# Patient Record
Sex: Male | Born: 1955 | Race: White | Hispanic: No | Smoking: Never smoker
Health system: Southern US, Community
[De-identification: ages and names within clinical notes are randomized; demographics above are authoritative.]

## PROBLEM LIST (undated history)

## (undated) DIAGNOSIS — M1990 Unspecified osteoarthritis, unspecified site: Secondary | ICD-10-CM

## (undated) DIAGNOSIS — N200 Calculus of kidney: Secondary | ICD-10-CM

## (undated) DIAGNOSIS — Z87442 Personal history of urinary calculi: Secondary | ICD-10-CM

## (undated) HISTORY — PX: REPLACEMENT TOTAL KNEE: SUR1224

## (undated) HISTORY — PX: COLONOSCOPY: SHX174

## (undated) HISTORY — PX: ANKLE FRACTURE SURGERY: SHX122

## (undated) HISTORY — PX: CYSTOSCOPY: SUR368

---

## 2018-07-09 ENCOUNTER — Emergency Department: Payer: Non-veteran care

## 2018-07-09 ENCOUNTER — Encounter: Payer: Self-pay | Admitting: Emergency Medicine

## 2018-07-09 ENCOUNTER — Emergency Department
Admission: EM | Admit: 2018-07-09 | Discharge: 2018-07-09 | Disposition: A | Discharge status: Home / Self Care | Payer: Non-veteran care | Attending: Emergency Medicine | Admitting: Emergency Medicine

## 2018-07-09 ENCOUNTER — Other Ambulatory Visit: Payer: Self-pay

## 2018-07-09 DIAGNOSIS — N200 Calculus of kidney: Secondary | ICD-10-CM

## 2018-07-09 DIAGNOSIS — R1031 Right lower quadrant pain: Secondary | ICD-10-CM | POA: Diagnosis present

## 2018-07-09 HISTORY — DX: Calculus of kidney: N20.0

## 2018-07-09 MED ORDER — HYDROCODONE-ACETAMINOPHEN 5-325 MG PO TABS: 1.0000 | ORAL_TABLET | Freq: Four times a day (QID) | ORAL | 0 refills | Status: DC | PRN

## 2018-07-09 MED ORDER — FENTANYL CITRATE (PF) 100 MCG/2ML IJ SOLN
100.0000 ug | Freq: Once | INTRAMUSCULAR | Status: AC
  Administered 2018-07-09: 100 ug via INTRAVENOUS
  Filled 2018-07-09: qty 2

## 2018-07-09 MED ORDER — TAMSULOSIN HCL 0.4 MG PO CAPS: 0.4000 mg | ORAL_CAPSULE | Freq: Every day | ORAL | 0 refills | Status: AC

## 2018-07-09 MED ORDER — HYDROCODONE-ACETAMINOPHEN 5-325 MG PO TABS
1.0000 | ORAL_TABLET | Freq: Once | ORAL | Status: AC
  Administered 2018-07-09: 1 via ORAL
  Filled 2018-07-09: qty 1

## 2018-07-09 MED ORDER — SODIUM CHLORIDE 0.9 % IV BOLUS
1000.0000 mL | Freq: Once | INTRAVENOUS | Status: AC
  Administered 2018-07-09: 1000 mL via INTRAVENOUS

## 2018-07-09 MED ORDER — LIDOCAINE HCL (CARDIAC) PF 100 MG/5ML IV SOSY
1.5000 mg/kg | PREFILLED_SYRINGE | Freq: Once | INTRAVENOUS | Status: AC
  Administered 2018-07-09: 146.2 mg via INTRAVENOUS
  Filled 2018-07-09: qty 10

## 2018-07-09 MED ORDER — IBUPROFEN 600 MG PO TABS: 600.0000 mg | ORAL_TABLET | Freq: Three times a day (TID) | ORAL | 0 refills | Status: DC | PRN

## 2018-07-09 MED ORDER — TAMSULOSIN HCL 0.4 MG PO CAPS
0.4000 mg | ORAL_CAPSULE | Freq: Once | ORAL | Status: AC
  Administered 2018-07-09: 0.4 mg via ORAL
  Filled 2018-07-09: qty 1

## 2018-07-09 MED ORDER — KETOROLAC TROMETHAMINE 30 MG/ML IJ SOLN
30.0000 mg | Freq: Once | INTRAMUSCULAR | Status: AC
  Administered 2018-07-09: 30 mg via INTRAMUSCULAR
  Filled 2018-07-09: qty 1

## 2018-07-09 NOTE — ED Triage Notes (Signed)
.  Patient ambulatory to triage with steady gait, without difficulty or distress noted; pt reports right flank pain, nonradiating; onset hr PTA; st hx kidney stones

## 2018-07-09 NOTE — ED Provider Notes (Signed)
[facility] Emergency Department Provider Note  ____________________________________________   First MD Initiated Contact with Patient 07/09/18 0309     (approximate)  I have reviewed the triage vital signs and the nursing notes.   HISTORY  Chief Complaint Flank Pain  HPI Patrick Williamson is a 62 y.o. male who self presents to the emergency department with sudden onset severe right flank pain radiating towards his right groin that began about an hour ago.  The pain feels similar to previous kidney stone that he had "years ago".  He is never had to have surgery to remove kidney stone.  No fevers or chills.  No dysuria.  No chest pain or shortness of breath.  The pain came on suddenly with severe is now constant and nothing seems to make it better or worse.     Past Medical History:  Diagnosis Date  . Kidney stone     There are no active problems to display for this patient.   Past Surgical History:  Procedure Laterality Date  . REPLACEMENT TOTAL KNEE Bilateral     Prior to Admission medications   Medication Sig Start Date End Date Taking? Authorizing Provider  HYDROcodone-acetaminophen (NORCO) 5-325 MG tablet Take 1 tablet by mouth every 6 (six) hours as needed for up to 15 doses for severe pain. 07/09/18   Merrily Brittle, MD  ibuprofen (ADVIL,MOTRIN) 600 MG tablet Take 1 tablet (600 mg total) by mouth every 8 (eight) hours as needed. 07/09/18   Merrily Brittle, MD  tamsulosin (FLOMAX) 0.4 MG CAPS capsule Take 1 capsule (0.4 mg total) by mouth daily. 07/09/18   Merrily Brittle, MD    Allergies Penicillins  No family history on file.  Social History Social History   Tobacco Use  . Smoking status: Never Smoker  . Smokeless tobacco: Never Used  Substance Use Topics  . Alcohol use: Not on file  . Drug use: Not on file    Review of Systems Constitutional: No fever/chills Eyes: No visual changes. ENT: No sore throat. Cardiovascular: Denies chest  pain. Respiratory: Denies shortness of breath. Gastrointestinal: Positive for abdominal pain.  Positive for nausea, no vomiting.  No diarrhea.  No constipation. Genitourinary: Negative for dysuria. Musculoskeletal: Positive for back pain. Skin: Negative for rash. Neurological: Negative for headaches, focal weakness or numbness.   ____________________________________________   PHYSICAL EXAM:  VITAL SIGNS: ED Triage Vitals  Enc Vitals Group     BP 07/09/18 0307 (!) 183/110     Pulse Rate 07/09/18 0307 75     Resp 07/09/18 0307 20     Temp 07/09/18 0307 97.7 F (36.5 C)     Temp Source 07/09/18 0307 Oral     SpO2 07/09/18 0307 100 %     Weight 07/09/18 0306 215 lb (97.5 kg)     Height 07/09/18 0306 5\' 10"  (1.778 m)     Head Circumference --      Peak Flow --      Pain Score 07/09/18 0305 10     Pain Loc --      Pain Edu? --      Excl. in GC? --     Constitutional: Alert and oriented x4 appears miserable writhing in pain on the bed Eyes: PERRL EOMI. Head: Atraumatic. Nose: No congestion/rhinnorhea. Mouth/Throat: No trismus Neck: No stridor.   Cardiovascular: Normal rate, regular rhythm. Grossly normal heart sounds.  Good peripheral circulation. Respiratory: Normal respiratory effort.  No retractions. Lungs CTAB and moving good  air Gastrointestinal: Soft nontender no costovertebral tenderness no peritonitis Musculoskeletal: No lower extremity edema   Neurologic:  Normal speech and language. No gross focal neurologic deficits are appreciated. Skin:  Skin is warm, dry and intact. No rash noted. Psychiatric: Mood and affect are normal. Speech and behavior are normal.    ____________________________________________   DIFFERENTIAL includes but not limited to  Renal colic, pyelonephritis, AAA, infected stone ____________________________________________   LABS (all labs ordered are listed, but only abnormal results are displayed)  Labs Reviewed - No data to  display   __________________________________________  EKG   ____________________________________________  RADIOLOGY  CT stone reviewed by me consistent with 5 mm right sided kidney stone ____________________________________________   PROCEDURES  Procedure(s) performed: no  Procedures  Critical Care performed: no  ____________________________________________   INITIAL IMPRESSION / ASSESSMENT AND PLAN / ED COURSE  Pertinent labs & imaging results that were available during my care of the patient were reviewed by me and considered in my medical decision making (see chart for details).   As part of my medical decision making, I reviewed the following data within the electronic MEDICAL RECORD NUMBER History obtained from family if available, nursing notes, old chart and ekg, as well as notes from prior ED visits.  On arrival the patient was exquisitely uncomfortable appearing with an exam most consistent with renal colic.  Given 30 mg intramuscular ketorolac as well as a tablet of hydrocodone with minimal improvement in his pain.  CT scan does show a large stone but the patient's symptoms have not improved significantly enough so I started a line and then gave him 100 mcg of IV fentanyl and 150 mg of IV lidocaine over 20 minutes.  Following this his pain went down to is 0.  As this is a mid ureter stone that is 5 mm we will also start him on Flomax and refer him to urology.  Strict return precautions have been given.      ____________________________________________   FINAL CLINICAL IMPRESSION(S) / ED DIAGNOSES  Final diagnoses:  Kidney stone      NEW MEDICATIONS STARTED DURING THIS VISIT:  Discharge Medication List as of 07/09/2018  3:50 AM    START taking these medications   Details  HYDROcodone-acetaminophen (NORCO) 5-325 MG tablet Take 1 tablet by mouth every 6 (six) hours as needed for up to 15 doses for severe pain., Starting Thu 07/09/2018, Print    ibuprofen  (ADVIL,MOTRIN) 600 MG tablet Take 1 tablet (600 mg total) by mouth every 8 (eight) hours as needed., Starting Thu 07/09/2018, Print    tamsulosin (FLOMAX) 0.4 MG CAPS capsule Take 1 capsule (0.4 mg total) by mouth daily., Starting Thu 07/09/2018, Print         Note:  This document was prepared using Dragon voice recognition software and may include unintentional dictation errors.     Merrily Brittle, MD 07/11/18 [phone]

## 2018-07-09 NOTE — Discharge Instructions (Addendum)
Please take your pain medication as needed for severe symptoms and use Flomax every day to help your kidney stone pass.  Follow-up with the urologist this coming week for reevaluation.  Return to the emergency department sooner for any new or worsening symptoms particularly if you develop a fever or chills at all.  It was a pleasure to take care of you today, and thank you for coming to our emergency department.  If you have any questions or concerns before leaving please ask the nurse to grab me and I'm more than happy to go through your aftercare instructions again.  If you were prescribed any opioid pain medication today such as Norco, Vicodin, Percocet, morphine, hydrocodone, or oxycodone please make sure you do not drive when you are taking this medication as it can alter your ability to drive safely.  If you have any concerns once you are home that you are not improving or are in fact getting worse before you can make it to your follow-up appointment, please do not hesitate to call [phone] and come back for further evaluation.  Merrily Brittle, MD  No results found for this or any previous visit. Ct Renal Stone Study  Result Date: 07/09/2018 CLINICAL DATA:  Right flank pain. EXAM: CT ABDOMEN AND PELVIS WITHOUT CONTRAST TECHNIQUE: Multidetector CT imaging of the abdomen and pelvis was performed following the standard protocol without IV contrast. COMPARISON:  None. FINDINGS: Lower chest: Lung bases are clear. Hepatobiliary: No focal liver abnormality is seen. No gallstones, gallbladder wall thickening, or biliary dilatation. Pancreas: Unremarkable. No pancreatic ductal dilatation or surrounding inflammatory changes. Spleen: Normal in size without focal abnormality. Adrenals/Urinary Tract: No adrenal gland nodules. Stone in the mid right ureter at the level of L3. Stone measures 5 mm diameter. Proximal hydronephrosis and hydroureter. Left kidney and ureter and the bladder are unremarkable. Stomach/Bowel:  Stomach is within normal limits. Appendix is not identified. No evidence of bowel wall thickening, distention, or inflammatory changes. Vascular/Lymphatic: No significant vascular findings are present. No enlarged abdominal or pelvic lymph nodes. Reproductive: Prostate gland is enlarged, measuring 4.5 cm diameter. Other: No abdominal wall hernia or abnormality. No abdominopelvic ascites. Musculoskeletal: No acute or significant osseous findings. IMPRESSION: 1. 5 mm stone in the mid right ureter with moderate proximal obstruction. 2. Enlarged prostate gland. Electronically Signed   By: Burman Nieves M.D.   On: 07/09/2018 03:44

## 2018-09-01 NOTE — H&P (Signed)
  Patient's anticipated LOS is less than 2 midnights, meeting these requirements: - Younger than 44 - Lives within 1 hour of care - Has a competent adult at home to recover with post-op recover - NO history of  - Chronic pain requiring opiods  - Diabetes  - Coronary Artery Disease  - Heart failure  - Heart attack  - Stroke  - DVT/VTE  - Cardiac arrhythmia  - Respiratory Failure/COPD  - Renal failure  - Anemia  - Advanced Liver disease       Patrick Williamson is an 62 y.o. male.    Chief Complaint: left shoulder pain  HPI: Pt is a 62 y.o. male complaining of left shoulder pain for multiple years. Pain had continually increased since the beginning. X-rays in the clinic show end-stage arthritic changes of the left shoulder. Pt has tried various conservative treatments which have failed to alleviate their symptoms, including injections and therapy. Various options are discussed with the patient. Risks, benefits and expectations were discussed with the patient. Patient understand the risks, benefits and expectations and wishes to proceed with surgery.   [facility]  D/C Plans: Home  PMH: Past Medical History:  Diagnosis Date  . Kidney stone     PSH: Past Surgical History:  Procedure Laterality Date  . REPLACEMENT TOTAL KNEE Bilateral     Social History:  reports that he has never smoked. He has never used smokeless tobacco. His alcohol and drug histories are not on file.  Allergies:  Allergies  Allergen Reactions  . Penicillins     Medications: No current facility-administered medications for this encounter.    Current Outpatient Medications  Medication Sig Dispense Refill  . HYDROcodone-acetaminophen (NORCO) 5-325 MG tablet Take 1 tablet by mouth every 6 (six) hours as needed for up to 15 doses for severe pain. 15 tablet 0  . ibuprofen (ADVIL,MOTRIN) 600 MG tablet Take 1 tablet (600 mg total) by mouth every 8 (eight) hours as needed. 30 tablet 0  .  tamsulosin (FLOMAX) 0.4 MG CAPS capsule Take 1 capsule (0.4 mg total) by mouth daily. 30 capsule 0    No results found for this or any previous visit (from the past 48 hour(s)). No results found.  ROS: Pain with rom of the left upper extremity  Physical Exam: Alert and oriented 62 y.o. male in no acute distress Cranial nerves 2-12 intact Cervical spine: full rom with no tenderness, nv intact distally Chest: active breath sounds bilaterally, no wheeze rhonchi or rales Heart: regular rate and rhythm, no murmur Abd: non tender non distended with active bowel sounds Hip is stable with rom  Left shoulder with crepitus and pain with rom nv intact distally No rashes or edema distally  Assessment/Plan Assessment: left shoulder end stage osteoarthritis  Plan:  Patient will undergo a left total shoulder by Dr. Ranell Patrick at [facility]. Risks benefits and expectations were discussed with the patient. Patient understand risks, benefits and expectations and wishes to proceed. Preoperative templating of the joint replacement has been completed, documented, and submitted to the Operating Room personnel in order to optimize intra-operative equipment management.   Alphonsa Overall PA-C, MPAS [facility] is now [employer] [address], [address], [place], [place] [address] Phone: [phone] [email or web address] Facebook  [employer]

## 2018-09-09 NOTE — Pre-Procedure Instructions (Signed)
Patrick Williamson  09/09/2018      [id number] - [place], [place] - [address] [place] [address] Phone: [phone] Fax: [phone]    Your procedure is scheduled on Friday November 15th.  Report to [facility] Admitting at 0530 A.M.  Call this number if you have problems the morning of surgery:  [phone]   Remember:  Do not eat or drink after midnight.    Take these medicines the morning of surgery with A SIP OF WATER   Tetrahydrozoline HCl (VISINE OP) if needed  7 days prior to surgery STOP taking any Aspirin(unless otherwise instructed by your surgeon), Aleve, Naproxen, Ibuprofen, Motrin, Advil, Goody's, BC's, all herbal medications, fish oil, and all vitamins     Do not wear jewelry.  Do not wear lotions, powders, or colognes, or deodorant.  Men may shave face and neck.  Do not bring valuables to the hospital.  [facility] is not responsible for any belongings or valuables.  Contacts, dentures or bridgework may not be worn into surgery.  Leave your suitcase in the car.  After surgery it may be brought to your room.  For patients admitted to the hospital, discharge time will be determined by your treatment team.  Patients discharged the day of surgery will not be allowed to drive home.    [facility]- Preparing For Surgery  Before surgery, you can play an important role. Because skin is not sterile, your skin needs to be as free of germs as possible. You can reduce the number of germs on your skin by washing with CHG (chlorahexidine gluconate) Soap before surgery.  CHG is an antiseptic cleaner which kills germs and bonds with the skin to continue killing germs even after washing.    Oral Hygiene is also important to reduce your risk of infection.  Remember - BRUSH YOUR TEETH THE MORNING OF SURGERY WITH YOUR REGULAR TOOTHPASTE  Please do not use if you have an allergy to CHG or antibacterial soaps. If your skin becomes reddened/irritated  stop using the CHG.  Do not shave (including legs and underarms) for at least 48 hours prior to first CHG shower. It is OK to shave your face.  Please follow these instructions carefully.   1. Shower the NIGHT BEFORE SURGERY and the MORNING OF SURGERY with CHG.   2. If you chose to wash your hair, wash your hair first as usual with your normal shampoo.  3. After you shampoo, rinse your hair and body thoroughly to remove the shampoo.  4. Use CHG as you would any other liquid soap. You can apply CHG directly to the skin and wash gently with a scrungie or a clean washcloth.   5. Apply the CHG Soap to your body ONLY FROM THE NECK DOWN.  Do not use on open wounds or open sores. Avoid contact with your eyes, ears, mouth and genitals (private parts). Wash Face and genitals (private parts)  with your normal soap.  6. Wash thoroughly, paying special attention to the area where your surgery will be performed.  7. Thoroughly rinse your body with warm water from the neck down.  8. DO NOT shower/wash with your normal soap after using and rinsing off the CHG Soap.  9. Pat yourself dry with a CLEAN TOWEL.  10. Wear CLEAN PAJAMAS to bed the night before surgery, wear comfortable clothes the morning of surgery  11. Place CLEAN SHEETS on your bed the  night of your first shower and DO NOT SLEEP WITH PETS.    Day of Surgery:  Do not apply any deodorants/lotions.  Please wear clean clothes to the hospital/surgery center.   Remember to brush your teeth WITH YOUR REGULAR TOOTHPASTE.    Please read over the following fact sheets that you were given.

## 2018-09-10 ENCOUNTER — Other Ambulatory Visit: Payer: Self-pay

## 2018-09-10 ENCOUNTER — Encounter (HOSPITAL_COMMUNITY)
Admission: RE | Admit: 2018-09-10 | Discharge: 2018-09-10 | Disposition: A | Discharge status: Home / Self Care | Payer: No Typology Code available for payment source | Source: Ambulatory Visit | Attending: Orthopedic Surgery | Admitting: Orthopedic Surgery

## 2018-09-10 ENCOUNTER — Encounter (HOSPITAL_COMMUNITY): Payer: Self-pay

## 2018-09-10 DIAGNOSIS — M19012 Primary osteoarthritis, left shoulder: Secondary | ICD-10-CM | POA: Insufficient documentation

## 2018-09-10 DIAGNOSIS — Z79899 Other long term (current) drug therapy: Secondary | ICD-10-CM | POA: Insufficient documentation

## 2018-09-10 DIAGNOSIS — Z7982 Long term (current) use of aspirin: Secondary | ICD-10-CM | POA: Diagnosis not present

## 2018-09-10 DIAGNOSIS — Z01812 Encounter for preprocedural laboratory examination: Secondary | ICD-10-CM | POA: Insufficient documentation

## 2018-09-10 HISTORY — DX: Personal history of urinary calculi: Z87.442

## 2018-09-10 HISTORY — DX: Unspecified osteoarthritis, unspecified site: M19.90

## 2018-09-10 LAB — CBC
HEMATOCRIT: 48.4 % (ref 39.0–52.0)
Hemoglobin: 15.8 g/dL (ref 13.0–17.0)
MCH: 30.6 pg (ref 26.0–34.0)
MCHC: 32.6 g/dL (ref 30.0–36.0)
MCV: 93.8 fL (ref 80.0–100.0)
NRBC: 0 % (ref 0.0–0.2)
PLATELETS: 246 10*3/uL (ref 150–400)
RBC: 5.16 MIL/uL (ref 4.22–5.81)
RDW: 12.3 % (ref 11.5–15.5)
WBC: 6.2 10*3/uL (ref 4.0–10.5)

## 2018-09-10 LAB — SURGICAL PCR SCREEN
MRSA, PCR: NEGATIVE
STAPHYLOCOCCUS AUREUS: NEGATIVE

## 2018-09-10 NOTE — Progress Notes (Signed)
[facility]   Blood Thinner Instructions: N/A  Aspirin Instructions: will stop 7 days prior to surgery  Anesthesia review: yes. Requested EKG  Patient denies shortness of breath, fever, cough and chest pain at PAT appointment   Patient verbalized understanding of instructions that were given to them at the PAT appointment. Patient was also instructed that they will need to review over the PAT instructions again at home before surgery.

## 2018-09-11 NOTE — Progress Notes (Signed)
Anesthesia Chart Review:   Case:  [id number] Date/Time:  09/18/18 0715   Procedure:  LEFT ANATOMIC TOTAL SHOULDER ARTHROPLASTY (Left Shoulder)   Anesthesia type:  General   Pre-op diagnosis:  left shoulder end stage osteoarthritis   Location:  [facility] ROOM 05 / [facility] OR   Surgeon:  Beverely Low, MD      DISCUSSION: Patient is a 62 year old male scheduled for the above procedure.   History includes never smoker, arthritis, nephrolithiasis (obstructing stone in the mid right ureter with worsening right sided hydronephrosis and Creatinine 1.4 07/10/18, s/p right ureteroscopy with lithotripsy and stent 07/29/18, [facility]).  He has medical clearance at "low risk" (note appears to be signed by Jana Hakim, MD with [facility]).   Patient thought he may have had an EKG done at the [facility], but records requested and received without EKG. Surgeon deferred EKG "per anesthesia." Based on known history, I do not think he meets anesthesia guidelines for required preoperative EKG. In addition, labs ordered "per anesthesia" and a BMET was not done at his PAT visit based on history given; however, labs later received from the [facility] showed at least mild renal insufficiency--but in the case of 07/10/18 Cr of 1.4, the obstructive right renal stone was likely contributing although his BUN and Cr were also slightly elevated with 12/03/17 labs showing BUN 26, Cr 1.29. I will order an ISTAT8 for the morning of surgery just to get get a more recent baseline.  If ISTAT8 results stable and otherwise no acute changes then I would anticipate that he can proceed as planned.     VS: BP 138/84   Pulse 63   Temp 36.6 C   Resp 20   Ht 5\' 10"  (1.778 m)   Wt 103.6 kg   SpO2 100%   BMI 32.76 kg/m    PROVIDERS: [facility]   LABS: Labs reviewed: Acceptable for surgery. CBC done with PAT labs based on anesthesia guidelines. Previous labs from PCP at [facility] on 12/03/17 showed Cr 1.29, BUN 26, NA 140, K 4.6, glucose 118 and Cr  1.4 07/10/18 in the setting of renal stone. ISTAT8 on the day of surgery for more recent baseline of his renal function.  (all labs ordered are listed, but only abnormal results are displayed)  Labs Reviewed  SURGICAL PCR SCREEN  CBC    EKG: No EKG received from the [facility]. No EKG in [facility]. EKG on 11/245/14 showed SB at 46 bpm per Result Narrative in [facility].    CV: N/A   Past Medical History:  Diagnosis Date  . Arthritis    everywhere  . History of kidney stones   . Kidney stone     Past Surgical History:  Procedure Laterality Date  . ANKLE FRACTURE SURGERY    . COLONOSCOPY    . CYSTOSCOPY    . REPLACEMENT TOTAL KNEE Bilateral     MEDICATIONS: . aspirin 325 MG EC tablet  . aspirin EC 81 MG tablet  . diphenhydrAMINE-APAP, sleep, (EXCEDRIN PM) 38-500 MG TABS  . HYDROcodone-acetaminophen (NORCO) 5-325 MG tablet  . ibuprofen (ADVIL,MOTRIN) 600 MG tablet  . sildenafil (REVATIO) 20 MG tablet  . tamsulosin (FLOMAX) 0.4 MG CAPS capsule  . Tetrahydrozoline HCl (VISINE OP)   No current facility-administered medications for this encounter.   He is on sildenafil PRN for ED. He will hold ASA 7 days prior to surgery.    Shonna Chock, PA-C [facility]esthesiology Phone [phone])  [phone] 09/11/2018 5:49 PM

## 2018-09-17 NOTE — Anesthesia Preprocedure Evaluation (Addendum)
Anesthesia Evaluation  Patient identified by MRN, date of birth, ID band Patient awake    Reviewed: Allergy & Precautions, NPO status , Patient's Chart, lab work & pertinent test results  History of Anesthesia Complications Negative for: history of anesthetic complications  Airway Mallampati: II  TM Distance: >3 FB Neck ROM: Full    Dental no notable dental hx. (+) Teeth Intact, Chipped,    Pulmonary neg pulmonary ROS,    Pulmonary exam normal        Cardiovascular negative cardio ROS Normal cardiovascular exam     Neuro/Psych negative neurological ROS  negative psych ROS   GI/Hepatic negative GI ROS, Neg liver ROS,   Endo/Other  negative endocrine ROS  Renal/GU Renal InsufficiencyRenal disease (renal insufficiency 2/2 obstruction from stones, s/p recent lithotripsy)  negative genitourinary   Musculoskeletal  (+) Arthritis , Osteoarthritis,    Abdominal   Peds  Hematology negative hematology ROS (+)   Anesthesia Other Findings   Reproductive/Obstetrics                           Anesthesia Physical Anesthesia Plan  ASA: II  Anesthesia Plan: General   Post-op Pain Management: GA combined w/ Regional for post-op pain   Induction: Intravenous  PONV Risk Score and Plan: 2 and Ondansetron, Dexamethasone, Midazolam and Treatment may vary due to age or medical condition  Airway Management Planned: Oral ETT  Additional Equipment: None  Intra-op Plan:   Post-operative Plan: Extubation in OR  Informed Consent: I have reviewed the patients History and Physical, chart, labs and discussed the procedure including the risks, benefits and alternatives for the proposed anesthesia with the patient or authorized representative who has indicated his/her understanding and acceptance.     Plan Discussed with:   Anesthesia Plan Comments:        Anesthesia Quick Evaluation

## 2018-09-18 ENCOUNTER — Inpatient Hospital Stay (HOSPITAL_COMMUNITY): Payer: No Typology Code available for payment source | Admitting: Vascular Surgery

## 2018-09-18 ENCOUNTER — Encounter (HOSPITAL_COMMUNITY)
Admission: RE | Disposition: A | Discharge status: Home / Self Care | Payer: Self-pay | Source: Home / Self Care | Attending: Orthopedic Surgery

## 2018-09-18 ENCOUNTER — Inpatient Hospital Stay (HOSPITAL_COMMUNITY)
Admission: RE | Admit: 2018-09-18 | Discharge: 2018-09-19 | DRG: 483 | Disposition: A | Discharge status: Home / Self Care | Payer: No Typology Code available for payment source | Attending: Orthopedic Surgery | Admitting: Orthopedic Surgery

## 2018-09-18 ENCOUNTER — Inpatient Hospital Stay (HOSPITAL_COMMUNITY): Payer: No Typology Code available for payment source

## 2018-09-18 ENCOUNTER — Encounter (HOSPITAL_COMMUNITY): Payer: Self-pay | Admitting: Anesthesiology

## 2018-09-18 ENCOUNTER — Other Ambulatory Visit: Payer: Self-pay

## 2018-09-18 DIAGNOSIS — Z880 Allergy status to penicillin: Secondary | ICD-10-CM | POA: Diagnosis not present

## 2018-09-18 DIAGNOSIS — Z87442 Personal history of urinary calculi: Secondary | ICD-10-CM | POA: Diagnosis not present

## 2018-09-18 DIAGNOSIS — Z79899 Other long term (current) drug therapy: Secondary | ICD-10-CM

## 2018-09-18 DIAGNOSIS — M25712 Osteophyte, left shoulder: Secondary | ICD-10-CM | POA: Diagnosis present

## 2018-09-18 DIAGNOSIS — Z96653 Presence of artificial knee joint, bilateral: Secondary | ICD-10-CM | POA: Diagnosis present

## 2018-09-18 DIAGNOSIS — Z96612 Presence of left artificial shoulder joint: Secondary | ICD-10-CM

## 2018-09-18 DIAGNOSIS — M19012 Primary osteoarthritis, left shoulder: Principal | ICD-10-CM | POA: Diagnosis present

## 2018-09-18 HISTORY — PX: TOTAL SHOULDER ARTHROPLASTY: SHX126

## 2018-09-18 LAB — POCT I-STAT, CHEM 8
BUN: 15 mg/dL (ref 6–20)
CALCIUM ION: 1.15 mmol/L (ref 1.15–1.40)
Chloride: 107 mmol/L (ref 98–111)
Creatinine, Ser: 1 mg/dL (ref 0.61–1.24)
Glucose, Bld: 110 mg/dL — ABNORMAL HIGH (ref 70–99)
HCT: 42 % (ref 39.0–52.0)
HEMOGLOBIN: 14.3 g/dL (ref 13.0–17.0)
Potassium: 3.9 mmol/L (ref 3.5–5.1)
SODIUM: 141 mmol/L (ref 135–145)
TCO2: 24 mmol/L (ref 22–32)

## 2018-09-18 SURGERY — ARTHROPLASTY, SHOULDER, TOTAL
Anesthesia: General | Site: Shoulder | Laterality: Left

## 2018-09-18 MED ORDER — OXYCODONE-ACETAMINOPHEN 5-325 MG PO TABS: 1.0000 | ORAL_TABLET | ORAL | 0 refills | Status: AC | PRN

## 2018-09-18 MED ORDER — PHENOL 1.4 % MT LIQD: 1.0000 | OROMUCOSAL | Status: DC | PRN

## 2018-09-18 MED ORDER — SUGAMMADEX SODIUM 200 MG/2ML IV SOLN
INTRAVENOUS | Status: AC
  Filled 2018-09-18: qty 2

## 2018-09-18 MED ORDER — BUPIVACAINE-EPINEPHRINE 0.25% -1:200000 IJ SOLN
INTRAMUSCULAR | Status: DC | PRN
  Administered 2018-09-18: 10 mL

## 2018-09-18 MED ORDER — THROMBIN 5000 UNITS EX SOLR
CUTANEOUS | Status: DC | PRN
  Administered 2018-09-18: 5000 [IU] via TOPICAL

## 2018-09-18 MED ORDER — CLINDAMYCIN PHOSPHATE 900 MG/50ML IV SOLN
900.0000 mg | INTRAVENOUS | Status: AC
  Administered 2018-09-18: 900 mg via INTRAVENOUS
  Filled 2018-09-18: qty 50

## 2018-09-18 MED ORDER — DIPHENHYDRAMINE HCL 25 MG PO CAPS
50.0000 mg | ORAL_CAPSULE | Freq: Every day | ORAL | Status: DC
  Administered 2018-09-18: 50 mg via ORAL
  Filled 2018-09-18: qty 2

## 2018-09-18 MED ORDER — HYDROMORPHONE HCL 1 MG/ML IJ SOLN: 0.5000 mg | INTRAMUSCULAR | Status: DC | PRN

## 2018-09-18 MED ORDER — ONDANSETRON HCL 4 MG/2ML IJ SOLN
INTRAMUSCULAR | Status: AC
  Filled 2018-09-18: qty 2

## 2018-09-18 MED ORDER — SILDENAFIL CITRATE 20 MG PO TABS: 60.0000 mg | ORAL_TABLET | Freq: Every day | ORAL | Status: DC | PRN

## 2018-09-18 MED ORDER — FENTANYL CITRATE (PF) 100 MCG/2ML IJ SOLN
INTRAMUSCULAR | Status: DC | PRN
  Administered 2018-09-18: 50 ug via INTRAVENOUS

## 2018-09-18 MED ORDER — METHOCARBAMOL 500 MG PO TABS
500.0000 mg | ORAL_TABLET | Freq: Four times a day (QID) | ORAL | Status: DC | PRN
  Administered 2018-09-18 – 2018-09-19 (×2): 500 mg via ORAL
  Filled 2018-09-18: qty 1

## 2018-09-18 MED ORDER — BISACODYL 10 MG RE SUPP: 10.0000 mg | Freq: Every day | RECTAL | Status: DC | PRN

## 2018-09-18 MED ORDER — METHOCARBAMOL 500 MG PO TABS
ORAL_TABLET | ORAL | Status: AC
  Filled 2018-09-18: qty 1

## 2018-09-18 MED ORDER — OXYCODONE HCL 5 MG PO TABS
5.0000 mg | ORAL_TABLET | ORAL | Status: DC | PRN
  Administered 2018-09-18 (×2): 10 mg via ORAL
  Administered 2018-09-19: 5 mg via ORAL
  Filled 2018-09-18 (×2): qty 2

## 2018-09-18 MED ORDER — ROCURONIUM BROMIDE 50 MG/5ML IV SOSY
PREFILLED_SYRINGE | INTRAVENOUS | Status: AC
  Filled 2018-09-18: qty 5

## 2018-09-18 MED ORDER — BUPIVACAINE-EPINEPHRINE (PF) 0.25% -1:200000 IJ SOLN
INTRAMUSCULAR | Status: AC
  Filled 2018-09-18: qty 30

## 2018-09-18 MED ORDER — HEMOSTATIC AGENTS (NO CHARGE) OPTIME
TOPICAL | Status: DC | PRN
  Administered 2018-09-18: 1

## 2018-09-18 MED ORDER — BUPIVACAINE HCL (PF) 0.5 % IJ SOLN
INTRAMUSCULAR | Status: DC | PRN
  Administered 2018-09-18: 20 mL via PERINEURAL

## 2018-09-18 MED ORDER — METOCLOPRAMIDE HCL 5 MG PO TABS: 5.0000 mg | ORAL_TABLET | Freq: Three times a day (TID) | ORAL | Status: DC | PRN

## 2018-09-18 MED ORDER — TETRAHYDROZOLINE HCL 0.05 % OP SOLN: 1.0000 [drp] | Freq: Every day | OPHTHALMIC | Status: DC | PRN

## 2018-09-18 MED ORDER — FENTANYL CITRATE (PF) 100 MCG/2ML IJ SOLN: 25.0000 ug | INTRAMUSCULAR | Status: DC | PRN

## 2018-09-18 MED ORDER — FENTANYL CITRATE (PF) 250 MCG/5ML IJ SOLN
INTRAMUSCULAR | Status: AC
  Filled 2018-09-18: qty 5

## 2018-09-18 MED ORDER — BUPIVACAINE LIPOSOME 1.3 % IJ SUSP
INTRAMUSCULAR | Status: DC | PRN
  Administered 2018-09-18: 10 mL

## 2018-09-18 MED ORDER — POLYETHYLENE GLYCOL 3350 17 G PO PACK: 17.0000 g | PACK | Freq: Every day | ORAL | Status: DC | PRN

## 2018-09-18 MED ORDER — LIDOCAINE 2% (20 MG/ML) 5 ML SYRINGE
INTRAMUSCULAR | Status: DC | PRN
  Administered 2018-09-18: 100 mg via INTRAVENOUS

## 2018-09-18 MED ORDER — LACTATED RINGERS IV SOLN
INTRAVENOUS | Status: DC | PRN
  Administered 2018-09-18: 07:00:00 via INTRAVENOUS

## 2018-09-18 MED ORDER — ASPIRIN EC 81 MG PO TBEC
81.0000 mg | DELAYED_RELEASE_TABLET | Freq: Every day | ORAL | Status: DC
  Administered 2018-09-18 – 2018-09-19 (×2): 81 mg via ORAL
  Filled 2018-09-18 (×2): qty 1

## 2018-09-18 MED ORDER — METOCLOPRAMIDE HCL 5 MG/ML IJ SOLN: 5.0000 mg | Freq: Three times a day (TID) | INTRAMUSCULAR | Status: DC | PRN

## 2018-09-18 MED ORDER — METHOCARBAMOL 1000 MG/10ML IJ SOLN
500.0000 mg | Freq: Four times a day (QID) | INTRAVENOUS | Status: DC | PRN
  Filled 2018-09-18: qty 5

## 2018-09-18 MED ORDER — METHOCARBAMOL 500 MG PO TABS: 500.0000 mg | ORAL_TABLET | Freq: Three times a day (TID) | ORAL | 1 refills | Status: AC | PRN

## 2018-09-18 MED ORDER — 0.9 % SODIUM CHLORIDE (POUR BTL) OPTIME
TOPICAL | Status: DC | PRN
  Administered 2018-09-18: 1000 mL

## 2018-09-18 MED ORDER — CLINDAMYCIN PHOSPHATE 600 MG/50ML IV SOLN
600.0000 mg | Freq: Four times a day (QID) | INTRAVENOUS | Status: AC
  Administered 2018-09-18 – 2018-09-19 (×3): 600 mg via INTRAVENOUS
  Filled 2018-09-18 (×3): qty 50

## 2018-09-18 MED ORDER — DIPHENHYDRAMINE-APAP (SLEEP) 38-500 MG PO TABS: 2.0000 | ORAL_TABLET | Freq: Every day | ORAL | Status: DC

## 2018-09-18 MED ORDER — ONDANSETRON HCL 4 MG/2ML IJ SOLN: 4.0000 mg | Freq: Four times a day (QID) | INTRAMUSCULAR | Status: DC | PRN

## 2018-09-18 MED ORDER — ONDANSETRON HCL 4 MG PO TABS: 4.0000 mg | ORAL_TABLET | Freq: Four times a day (QID) | ORAL | Status: DC | PRN

## 2018-09-18 MED ORDER — OXYCODONE HCL 5 MG/5ML PO SOLN: 5.0000 mg | Freq: Once | ORAL | Status: DC | PRN

## 2018-09-18 MED ORDER — ACETAMINOPHEN 500 MG PO TABS
1000.0000 mg | ORAL_TABLET | Freq: Every day | ORAL | Status: DC
  Administered 2018-09-18: 1000 mg via ORAL
  Filled 2018-09-18: qty 2

## 2018-09-18 MED ORDER — ONDANSETRON HCL 4 MG/2ML IJ SOLN: 4.0000 mg | Freq: Once | INTRAMUSCULAR | Status: DC | PRN

## 2018-09-18 MED ORDER — CHLORHEXIDINE GLUCONATE 4 % EX LIQD: 60.0000 mL | Freq: Once | CUTANEOUS | Status: DC

## 2018-09-18 MED ORDER — DEXAMETHASONE SODIUM PHOSPHATE 10 MG/ML IJ SOLN
INTRAMUSCULAR | Status: DC | PRN
  Administered 2018-09-18: 10 mg via INTRAVENOUS

## 2018-09-18 MED ORDER — ACETAMINOPHEN 325 MG PO TABS: 325.0000 mg | ORAL_TABLET | Freq: Four times a day (QID) | ORAL | Status: DC | PRN

## 2018-09-18 MED ORDER — LIDOCAINE 2% (20 MG/ML) 5 ML SYRINGE
INTRAMUSCULAR | Status: AC
  Filled 2018-09-18: qty 5

## 2018-09-18 MED ORDER — PROPOFOL 10 MG/ML IV BOLUS
INTRAVENOUS | Status: AC
  Filled 2018-09-18: qty 20

## 2018-09-18 MED ORDER — DOCUSATE SODIUM 100 MG PO CAPS
100.0000 mg | ORAL_CAPSULE | Freq: Two times a day (BID) | ORAL | Status: DC
  Administered 2018-09-18 – 2018-09-19 (×2): 100 mg via ORAL
  Filled 2018-09-18 (×3): qty 1

## 2018-09-18 MED ORDER — THROMBIN (RECOMBINANT) 5000 UNITS EX SOLR
CUTANEOUS | Status: AC
  Filled 2018-09-18: qty 5000

## 2018-09-18 MED ORDER — ROCURONIUM BROMIDE 50 MG/5ML IV SOSY
PREFILLED_SYRINGE | INTRAVENOUS | Status: DC | PRN
  Administered 2018-09-18: 50 mg via INTRAVENOUS

## 2018-09-18 MED ORDER — PROPOFOL 10 MG/ML IV BOLUS
INTRAVENOUS | Status: DC | PRN
  Administered 2018-09-18: 200 mg via INTRAVENOUS

## 2018-09-18 MED ORDER — SUGAMMADEX SODIUM 200 MG/2ML IV SOLN
INTRAVENOUS | Status: DC | PRN
  Administered 2018-09-18: 200 mg via INTRAVENOUS

## 2018-09-18 MED ORDER — OXYCODONE HCL 5 MG PO TABS: 5.0000 mg | ORAL_TABLET | Freq: Once | ORAL | Status: DC | PRN

## 2018-09-18 MED ORDER — MIDAZOLAM HCL 2 MG/2ML IJ SOLN
INTRAMUSCULAR | Status: AC
  Filled 2018-09-18: qty 2

## 2018-09-18 MED ORDER — DEXAMETHASONE SODIUM PHOSPHATE 10 MG/ML IJ SOLN
INTRAMUSCULAR | Status: AC
  Filled 2018-09-18: qty 1

## 2018-09-18 MED ORDER — SODIUM CHLORIDE 0.9 % IV SOLN
INTRAVENOUS | Status: DC
  Administered 2018-09-18: 18:00:00 via INTRAVENOUS

## 2018-09-18 MED ORDER — OXYCODONE HCL 5 MG PO TABS
ORAL_TABLET | ORAL | Status: AC
  Filled 2018-09-18: qty 2

## 2018-09-18 MED ORDER — MIDAZOLAM HCL 5 MG/5ML IJ SOLN
INTRAMUSCULAR | Status: DC | PRN
  Administered 2018-09-18: 2 mg via INTRAVENOUS

## 2018-09-18 MED ORDER — ASPIRIN EC 325 MG PO TBEC: 650.0000 mg | DELAYED_RELEASE_TABLET | Freq: Every day | ORAL | Status: DC | PRN

## 2018-09-18 MED ORDER — MENTHOL 3 MG MT LOZG: 1.0000 | LOZENGE | OROMUCOSAL | Status: DC | PRN

## 2018-09-18 MED ORDER — SODIUM CHLORIDE 0.9 % IV SOLN
INTRAVENOUS | Status: DC | PRN
  Administered 2018-09-18: 25 ug/min via INTRAVENOUS

## 2018-09-18 MED ORDER — ONDANSETRON HCL 4 MG/2ML IJ SOLN
INTRAMUSCULAR | Status: DC | PRN
  Administered 2018-09-18: 4 mg via INTRAVENOUS

## 2018-09-18 SURGICAL SUPPLY — 69 items
BIT DRILL 5/64X5 DISP (BIT) ×3 IMPLANT
BLADE SAW SAG 73X25 THK (BLADE) ×2
BLADE SAW SGTL 73X25 THK (BLADE) ×1 IMPLANT
BODY UNITE ANATOMIC SZ12 (Miscellaneous) ×3 IMPLANT
CEMENT HV SMART SET (Cement) ×3 IMPLANT
CLOSURE STERI-STRIP 1/2X4 (GAUZE/BANDAGES/DRESSINGS) ×1
CLOSURE WOUND 1/2 X4 (GAUZE/BANDAGES/DRESSINGS) ×1
CLSR STERI-STRIP ANTIMIC 1/2X4 (GAUZE/BANDAGES/DRESSINGS) ×2 IMPLANT
CONT SPEC 4OZ CLIKSEAL STRL BL (MISCELLANEOUS) ×3 IMPLANT
COVER SURGICAL LIGHT HANDLE (MISCELLANEOUS) ×3 IMPLANT
COVER WAND RF STERILE (DRAPES) ×3 IMPLANT
DRAPE INCISE IOBAN 66X45 STRL (DRAPES) ×3 IMPLANT
DRAPE ORTHO SPLIT 77X108 STRL (DRAPES) ×4
DRAPE SURG ORHT 6 SPLT 77X108 (DRAPES) ×2 IMPLANT
DRAPE U-SHAPE 47X51 STRL (DRAPES) ×3 IMPLANT
DRSG ADAPTIC 3X8 NADH LF (GAUZE/BANDAGES/DRESSINGS) ×3 IMPLANT
DRSG PAD ABDOMINAL 8X10 ST (GAUZE/BANDAGES/DRESSINGS) ×3 IMPLANT
DURAPREP 26ML APPLICATOR (WOUND CARE) ×3 IMPLANT
ELECT BLADE 4.0 EZ CLEAN MEGAD (MISCELLANEOUS) ×3
ELECT NEEDLE TIP 2.8 STRL (NEEDLE) ×3 IMPLANT
ELECT REM PT RETURN 9FT ADLT (ELECTROSURGICAL) ×3
ELECTRODE BLDE 4.0 EZ CLN MEGD (MISCELLANEOUS) ×1 IMPLANT
ELECTRODE REM PT RTRN 9FT ADLT (ELECTROSURGICAL) ×1 IMPLANT
GAUZE SPONGE 4X4 12PLY STRL (GAUZE/BANDAGES/DRESSINGS) ×3 IMPLANT
GLENOID ANCHOR PEG CROSSLK 48 (Orthopedic Implant) ×3 IMPLANT
GLOVE BIOGEL PI ORTHO PRO 7.5 (GLOVE) ×2
GLOVE BIOGEL PI ORTHO PRO SZ8 (GLOVE) ×2
GLOVE ORTHO TXT STRL SZ7.5 (GLOVE) ×3 IMPLANT
GLOVE PI ORTHO PRO STRL 7.5 (GLOVE) ×1 IMPLANT
GLOVE PI ORTHO PRO STRL SZ8 (GLOVE) ×1 IMPLANT
GLOVE SURG ORTHO 8.5 STRL (GLOVE) ×3 IMPLANT
GOWN STRL REUS W/ TWL XL LVL3 (GOWN DISPOSABLE) ×2 IMPLANT
GOWN STRL REUS W/TWL XL LVL3 (GOWN DISPOSABLE) ×4
HEAD HUMERAL ECCEN 52MX21M (Head) ×3 IMPLANT
KIT BASIN OR (CUSTOM PROCEDURE TRAY) ×3 IMPLANT
KIT TURNOVER KIT B (KITS) ×3 IMPLANT
MANIFOLD NEPTUNE II (INSTRUMENTS) ×3 IMPLANT
NDL SUT 6 .5 CRC .975X.05 MAYO (NEEDLE) ×1 IMPLANT
NEEDLE 1/2 CIR MAYO (NEEDLE) ×3 IMPLANT
NEEDLE HYPO 25GX1X1/2 BEV (NEEDLE) ×3 IMPLANT
NEEDLE MAYO TAPER (NEEDLE) ×2
NS IRRIG 1000ML POUR BTL (IV SOLUTION) ×3 IMPLANT
PACK SHOULDER (CUSTOM PROCEDURE TRAY) ×3 IMPLANT
PAD ARMBOARD 7.5X6 YLW CONV (MISCELLANEOUS) ×6 IMPLANT
PIN METAGLENE 2.5 (PIN) ×3 IMPLANT
SLING ARM FOAM STRAP LRG (SOFTGOODS) ×3 IMPLANT
SLING ARM IMMOBILIZER LRG (SOFTGOODS) ×3 IMPLANT
SMARTMIX MINI TOWER (MISCELLANEOUS) ×3
SPONGE LAP 18X18 X RAY DECT (DISPOSABLE) ×3 IMPLANT
SPONGE LAP 4X18 RFD (DISPOSABLE) ×6 IMPLANT
SPONGE SURGIFOAM ABS GEL SZ50 (HEMOSTASIS) ×3 IMPLANT
STEM UNITE SZ12 (Stem) ×3 IMPLANT
STRIP CLOSURE SKIN 1/2X4 (GAUZE/BANDAGES/DRESSINGS) ×2 IMPLANT
SUCTION FRAZIER HANDLE 10FR (MISCELLANEOUS) ×2
SUCTION TUBE FRAZIER 10FR DISP (MISCELLANEOUS) ×1 IMPLANT
SUT FIBERWIRE #2 38 T-5 BLUE (SUTURE) ×12
SUT MNCRL AB 4-0 PS2 18 (SUTURE) ×3 IMPLANT
SUT VIC AB 0 CT1 27 (SUTURE) ×2
SUT VIC AB 0 CT1 27XBRD ANBCTR (SUTURE) ×1 IMPLANT
SUT VIC AB 0 CT2 27 (SUTURE) ×6 IMPLANT
SUT VIC AB 2-0 CT1 27 (SUTURE) ×2
SUT VIC AB 2-0 CT1 TAPERPNT 27 (SUTURE) ×1 IMPLANT
SUT VICRYL AB 2 0 TIES (SUTURE) ×3 IMPLANT
SUTURE FIBERWR #2 38 T-5 BLUE (SUTURE) ×4 IMPLANT
SYR CONTROL 10ML LL (SYRINGE) ×3 IMPLANT
TAPE CLOTH SURG 6X10 WHT LF (GAUZE/BANDAGES/DRESSINGS) ×3 IMPLANT
TOWEL OR 17X26 10 PK STRL BLUE (TOWEL DISPOSABLE) ×3 IMPLANT
TOWER SMARTMIX MINI (MISCELLANEOUS) ×1 IMPLANT
YANKAUER SUCT BULB TIP NO VENT (SUCTIONS) ×3 IMPLANT

## 2018-09-18 NOTE — Discharge Instructions (Signed)
Ice to the shoulder constantly.  Keep the incision covered and clean and dry for one week, then ok to get it wet in the shower. ° °Do exercise as instructed several times per day. ° °DO NOT reach behind your back or push up out of a chair with the operative arm. ° °Use a sling while you are up and around for comfort, may remove while seated.  Keep pillow propped behind the operative elbow. ° °Follow up with Dr Tailer Volkert in two weeks in the office, call [phone] for appt °

## 2018-09-18 NOTE — Anesthesia Procedure Notes (Signed)
Anesthesia Regional Block: Interscalene brachial plexus block   Pre-Anesthetic Checklist: ,, timeout performed, Correct Patient, Correct Site, Correct Laterality, Correct Procedure, Correct Position, site marked, Risks and benefits discussed,  Surgical consent,  Pre-op evaluation,  At surgeon's request and post-op pain management  Laterality: Left  Prep: chloraprep       Needles:  Injection technique: Single-shot  Needle Type: Echogenic Stimulator Needle     Needle Length: 9cm  Needle Gauge: 21     Additional Needles:   Procedures:,,,, ultrasound used (permanent image in chart),,,,  Narrative:  Start time: 09/18/2018 7:10 AM End time: 09/18/2018 7:15 AM Injection made incrementally with aspirations every 5 mL.  Performed by: Personally  Anesthesiologist: Lucretia KernWitman, Loralye Loberg E, MD  Additional Notes: Monitors applied. Injection made in 5cc increments. No resistance to injection. Good needle visualization. Patient tolerated procedure well.

## 2018-09-18 NOTE — Anesthesia Procedure Notes (Signed)
Procedure Name: Intubation Date/Time: 09/18/2018 7:50 AM Performed by: Kyung Rudd, CRNA Pre-anesthesia Checklist: Patient identified, Emergency Drugs available, Suction available and Patient being monitored Patient Re-evaluated:Patient Re-evaluated prior to induction Oxygen Delivery Method: Circle system utilized Preoxygenation: Pre-oxygenation with 100% oxygen Induction Type: IV induction Ventilation: Mask ventilation without difficulty and Oral airway inserted - appropriate to patient size Laryngoscope Size: Mac and 4 Grade View: Grade I Tube type: Oral Tube size: 7.5 mm Number of attempts: 1 Airway Equipment and Method: Stylet Placement Confirmation: ETT inserted through vocal cords under direct vision,  positive ETCO2 and breath sounds checked- equal and bilateral Secured at: 22 cm Tube secured with: Tape Dental Injury: Teeth and Oropharynx as per pre-operative assessment

## 2018-09-18 NOTE — Transfer of Care (Signed)
Immediate Anesthesia Transfer of Care Note  Patient: Patrick Williamson  Procedure(s) Performed: LEFT ANATOMIC TOTAL SHOULDER ARTHROPLASTY (Left Shoulder)  Patient Location: PACU  Anesthesia Type:GA combined with regional for post-op pain  Level of Consciousness: awake, alert  and oriented  Airway & Oxygen Therapy: Patient Spontanous Breathing and Patient connected to nasal cannula oxygen  Post-op Assessment: Report given to RN and Post -op Vital signs reviewed and stable  Post vital signs: Reviewed and stable  Last Vitals:  Vitals Value Taken Time  BP 135/80 09/18/2018 10:32 AM  Temp    Pulse 80 09/18/2018 10:35 AM  Resp 17 09/18/2018 10:35 AM  SpO2 98 % 09/18/2018 10:35 AM  Vitals shown include unvalidated device data.  Last Pain:  Vitals:   09/18/18 0624  PainSc: 2          Complications: No apparent anesthesia complications

## 2018-09-18 NOTE — Interval H&P Note (Signed)
History and Physical Interval Note:  09/18/2018 7:36 AM  Patrick Williamson  has presented today for surgery, with the diagnosis of left shoulder end stage osteoarthritis  The various methods of treatment have been discussed with the patient and family. After consideration of risks, benefits and other options for treatment, the patient has consented to  Procedure(s): LEFT ANATOMIC TOTAL SHOULDER ARTHROPLASTY (Left) as a surgical intervention .  The patient's history has been reviewed, patient examined, no change in status, stable for surgery.  I have reviewed the patient's chart and labs.  Questions were answered to the patient's satisfaction.     [name],STEVEN R

## 2018-09-18 NOTE — Plan of Care (Signed)

## 2018-09-18 NOTE — Brief Op Note (Signed)
09/18/2018  10:36 AM  PATIENT:  Patrick Williamson  62 y.o. male  PRE-OPERATIVE DIAGNOSIS:  left shoulder end stage osteoarthritis  POST-OPERATIVE DIAGNOSIS:  left shoulder end stage osteoarthritis  PROCEDURE:  Procedure(s): LEFT ANATOMIC TOTAL SHOULDER ARTHROPLASTY (Left) DePuy Global Unite  SURGEON:  Surgeon(s) and Role:    Beverely Low* Dianah Pruett, MD - Primary  PHYSICIAN ASSISTANT:   ASSISTANTS: Thea Gisthomas B Dixon, PA-C   ANESTHESIA:   regional and general  EBL:  150 mL   BLOOD ADMINISTERED:none  DRAINS: none   LOCAL MEDICATIONS USED:  MARCAINE     SPECIMEN:  No Specimen  DISPOSITION OF SPECIMEN:  N/A  COUNTS:  YES  TOURNIQUET:  * No tourniquets in log *  DICTATION: .Other Dictation: Dictation Number [id number]  PLAN OF CARE: Admit to inpatient   PATIENT DISPOSITION:  PACU - hemodynamically stable.   Delay start of Pharmacological VTE agent (>24hrs) due to surgical blood loss or risk of bleeding: not applicable

## 2018-09-18 NOTE — Op Note (Signed)
NAME: ROMMEL, HOGSTON MEDICAL RECORD ZO:[id number] ACCOUNT [id number] DATE OF BIRTH:1956-05-06 FACILITY: [facility] LOCATION: [facility] PHYSICIAN:STEVEN Russ Halo, MD  OPERATIVE REPORT  DATE OF PROCEDURE:  09/18/2018  PREOPERATIVE DIAGNOSIS:  Left shoulder end-stage arthritis.  POSTOPERATIVE DIAGNOSIS:  Left shoulder end-stage arthritis.  PROCEDURE PERFORMED:  Left anatomic shoulder replacement using DePuy Global Unite system.  ATTENDING SURGEON:  Malon Kindle, MD  ASSISTANT:  Modesto Charon, [place], who was scrubbed during the entire procedure and necessary for satisfactory completion of surgery.  ANESTHESIA:  General anesthesia was used plus interscalene block.  ESTIMATED BLOOD LOSS:  100 mL.  FLUID REPLACEMENT:  1500 mL crystalloid.  INSTRUMENT COUNTS:  Correct.  COMPLICATIONS:  No complications.  ANTIBIOTICS:  Perioperative antibiotics were given.  INDICATIONS:  The patient is a 62 year old male with a history of worsening left shoulder pain and dysfunction secondary to end-stage primary arthritis.  The patient has had a failure of conservative management over an extended period of time and  presents with disabling pain and poor function, desiring operative treatment to the shoulder.  Informed consent obtained.  DESCRIPTION OF PROCEDURE:  After an adequate level of anesthesia was achieved, the patient was positioned in the modified beach chair position.  Left shoulder correctly identified and sterilely prepped and draped in the usual manner.  Timeout called.  We  verified correct patient and correct site.  We then entered the shoulder using standard deltopectoral approach starting at the coracoid process extending down to the anterior humerus.  Dissection down through the subcutaneous tissues using Bovie  electrocautery.  Cephalic vein identified and taken laterally with the deltoid pectoralis taken medially.  Conjoined tendon identified and retracted medially.  Deep retractors  placed.  We tenodesed the biceps in situ with 0 Vicryl figure-of-eight suture.   We then released the subscapularis subperiosteally off the lesser tuberosity and tagged for repair at the end.  We released the inferior capsule, progressively externally rotating the humerus.  We then placed a T-handled elevator over the top of the  humeral head underneath the rotator cuff and a large Crego elevator on the medial side of the humeral neck.  We then placed the elbow at the patient's side and 20 degrees of external rotation and cut the humerus using a neck resection guide and the  oscillating saw.  We removed excess osteophytes with a rongeur.  We then subluxed the humerus posteriorly and did a 360-degree capsular labral excision.  We were able to visualize the glenoid face.  There was complete loss of cartilage both on the  humeral head and glenoid face.  We found our center point for our glenoid reaming.  We placed our guide pin and then reamed first with the low profile reamer and then the regular reamer for the 48 glenoid.  We were using the global APG glenoid or anchor  peg glenoid.  We then did our peripheral hand reaming.  We drilled our central peg hole and our three peripheral holes using the guide referencing off the inferior 6 o'clock position on the scapular neck.  At this point, we used Gelfoam soaked with  thrombin in the three peripheral holes.  We then vacuum mixed DePuy high viscosity cement, cemented the three peripheral holes and impacted the APG glenoid into position.  We held that until cement was hardened on the field.  We irrigated thoroughly.  We  then went ahead and extended the shoulder.  We completed our humeral preparation with a reaming up to a size 12  into the canal size and then using broaches for the 10 and the 12 placed in 20 degrees of retroversion.  Once we had that humeral preparation  done, we placed our trial implant and then used a 52 x 18 eccentric and then went with a 52 x  21 eccentric.  We gave us excellent coverage over the cut face of the bone.  We made sure again no additional osteophytes were noted.  We did retrieve about 4  or 5 large loose bodies from the shoulder.  We did a final inspection for loose bodies.  We removed the trial components from the humeral side.  We then selected the real press-fit Global Unite stem size 12 with the 52 x 21 eccentric.  We impacted the  stem into place using available bone graft and impacted grafting technique after we placed drill holes in the bone and placed sutures for the repair of the subscap.  We then took the 52 x 21 eccentric.  Dialed that eccentricity superiorly and  posteriorly.  We had excellent coverage and impacted that and reduced the shoulder.  The shoulder was stable and had good motion.  We then repaired anatomically the subscapularis back to bone utilizing transosseous sutures as well as rotator interval  repair.  The tenodesis was reinforced as well.  We had excellent motion with the shoulder and it was quite stable.  We irrigated again and then closed deltopectoral interval with 0 Vicryl suture followed by 2-0 Vicryl for subcutaneous closure and 4-0  Monocryl for skin.  Steri-Strips applied followed by sterile dressing.  The patient tolerated surgery well.  TN/NUANCE  D:09/18/2018 T:09/18/2018 JOB:[id number]

## 2018-09-19 ENCOUNTER — Encounter (HOSPITAL_COMMUNITY): Payer: Self-pay | Admitting: Orthopedic Surgery

## 2018-09-19 LAB — BASIC METABOLIC PANEL
Anion gap: 11 (ref 5–15)
BUN: 14 mg/dL (ref 6–20)
CALCIUM: 8.7 mg/dL — AB (ref 8.9–10.3)
CO2: 20 mmol/L — ABNORMAL LOW (ref 22–32)
Chloride: 103 mmol/L (ref 98–111)
Creatinine, Ser: 1.22 mg/dL (ref 0.61–1.24)
GFR calc Af Amer: >60 mL/min (ref >60)
GFR calc non Af Amer: >60 mL/min (ref >60)
Glucose, Bld: 254 mg/dL — ABNORMAL HIGH (ref 70–99)
POTASSIUM: 4 mmol/L (ref 3.5–5.1)
SODIUM: 134 mmol/L — AB (ref 135–145)

## 2018-09-19 LAB — HEMOGLOBIN AND HEMATOCRIT, BLOOD
HCT: 36.8 % — ABNORMAL LOW (ref 39.0–52.0)
HEMOGLOBIN: 12.5 g/dL — AB (ref 13.0–17.0)

## 2018-09-19 NOTE — Plan of Care (Signed)
  Problem: Nutrition: Goal: Adequate nutrition will be maintained Outcome: Progressing   Problem: Elimination: Goal: Will not experience complications related to bowel motility Outcome: Progressing   Problem: Pain Managment: Goal: General experience of comfort will improve Outcome: Progressing   Problem: Safety: Goal: Ability to remain free from injury will improve Outcome: Progressing   

## 2018-09-19 NOTE — Progress Notes (Signed)
Provided discharge education/instructions, all questions and concerns addressed, Pt not in distress, Pt to discharge home with belongings.

## 2018-09-19 NOTE — Progress Notes (Signed)
Subjective: 1 Day Post-Op Procedure(s) (LRB): LEFT ANATOMIC TOTAL SHOULDER ARTHROPLASTY (Left) Patient reports pain as mild.  Motor function has returned. Still good pain control at shoulder level  Objective: Vital signs in last 24 hours: Temp:  [97.9 F (36.6 C)-99 F (37.2 C)] 98.4 F (36.9 C) (11/16 0455) Pulse Rate:  [75-94] 75 (11/16 0455) Resp:  [15-27] 16 (11/15 1630) BP: (118-135)/(67-89) 119/68 (11/16 0455) SpO2:  [94 %-100 %] 96 % (11/16 0455)  Intake/Output from previous day: 11/15 0701 - 11/16 0700 In: 1536.8 [P.O.:220; I.V.:1195.9; IV Piggyback:120.9] Out: 150 [Blood:150] Intake/Output this shift: No intake/output data recorded.  Recent Labs    09/18/18 0623 09/19/18 0435  HGB 14.3 12.5*   Recent Labs    09/18/18 0623 09/19/18 0435  HCT 42.0 36.8*   Recent Labs    09/18/18 0623 09/19/18 0435  NA 141 134*  K 3.9 4.0  CL 107 103  CO2  --  20*  BUN 15 14  CREATININE 1.00 1.22  GLUCOSE 110* 254*  CALCIUM  --  8.7*   No results for input(s): LABPT, INR in the last 72 hours.  Neurologically intact Neurovascular intact No cellulitis present Compartment soft   Assessment/Plan: 1 Day Post-Op Procedure(s) (LRB): LEFT ANATOMIC TOTAL SHOULDER ARTHROPLASTY (Left) Advance diet Up with therapy  Discharge home    Patrick Williamson 09/19/2018, 8:26 AM

## 2018-09-19 NOTE — Evaluation (Signed)
Occupational Therapy Evaluation Patient Details Name: Patrick Williamson MRN: [id number] DOB: [birth year] Today's Date: 09/19/2018    History of Present Illness s/p LEFT ANATOMIC TOTAL SHOULDER ARTHROPLASTY    Clinical Impression   Pt admitted with the above diagnoses and presents with below problem list. PTA pt was independent with ADLs. Pt is currently supervision-min guard with functional transfers and mobility, setup - min A with bathing/dressing/grooming tasks. All shoulder education completed this session. Pt is for d/c home this morning. No further acute OT needs indicated.      Follow Up Recommendations  Follow surgeon's recommendation for DC plan and follow-up therapies    Equipment Recommendations  None recommended by OT    Recommendations for Other Services       Precautions / Restrictions Precautions Precautions: Shoulder Shoulder Interventions: Shoulder sling/immobilizer;At all times;Off for dressing/bathing/exercises Precaution Booklet Issued: Yes (comment) Required Braces or Orthoses: Sling Restrictions Weight Bearing Restrictions: Yes LUE Weight Bearing: Non weight bearing      Mobility Bed Mobility Overal bed mobility: Needs Assistance Bed Mobility: Supine to Sit     Supine to sit: Modified independent (Device/Increase time)        Transfers Overall transfer level: Needs assistance Equipment used: None Transfers: Sit to/from Stand Sit to Stand: Supervision;Min guard              Balance Overall balance assessment: No apparent balance deficits (not formally assessed)                                         ADL either performed or assessed with clinical judgement   ADL Overall ADL's : Needs assistance/impaired Eating/Feeding: Set up;Sitting   Grooming: Minimal assistance;Standing;Set up   Upper Body Bathing: Minimal assistance   Lower Body Bathing: Min guard   Upper Body Dressing : Minimal assistance;Sitting   Lower  Body Dressing: Minimal assistance;Sit to/from stand Lower Body Dressing Details (indicate cue type and reason): assist for fasterners, min guard for dynamic standing balance Toilet Transfer: Supervision/safety   Toileting- Clothing Manipulation and Hygiene: Min guard;Minimal assistance   Tub/ Shower Transfer: Min guard   Functional mobility during ADLs: Supervision/safety General ADL Comments: Pt completed bed mobility, UB/LB dressing, and in-room mobility.     Vision         Perception     Praxis      Pertinent Vitals/Pain Pain Assessment: 0-10 Pain Score: 2  Pain Location: L shoulder Pain Descriptors / Indicators: Sore Pain Intervention(s): Limited activity within patient's tolerance;Monitored during session;Repositioned     Hand Dominance Right   Extremity/Trunk Assessment Upper Extremity Assessment Upper Extremity Assessment: LUE deficits/detail LUE Deficits / Details: s/p LEFT ANATOMIC TOTAL SHOULDER ARTHROPLASTY   Lower Extremity Assessment Lower Extremity Assessment: Overall WFL for tasks assessed   Cervical / Trunk Assessment Cervical / Trunk Assessment: Normal   Communication Communication Communication: No difficulties   Cognition Arousal/Alertness: Awake/alert Behavior During Therapy: WFL for tasks assessed/performed Overall Cognitive Status: Within Functional Limits for tasks assessed                                     General Comments       Exercises Exercises: Other exercises Other Exercises Other Exercises: e/w/h ROM in supine, lap slides in seated position   Shoulder Instructions  Home Living Family/patient expects to be discharged to:: Private residence Living Arrangements: Spouse/significant other Available Help at Discharge: Family Type of Home: House Home Access: Level entry     Home Layout: Two level Alternate Level Stairs-Number of Steps: 18 Alternate Level Stairs-Rails: Can reach both Bathroom Shower/Tub:  Walk-in shower;Tub only;Tub/shower unit   Bathroom Toilet: Standard     Home Equipment: None          Prior Functioning/Environment Level of Independence: Independent                 OT Problem List: Impaired balance (sitting and/or standing);Decreased knowledge of use of DME or AE;Decreased knowledge of precautions;Pain;Impaired UE functional use      OT Treatment/Interventions:      OT Goals(Current goals can be found in the care plan section) Acute Rehab OT Goals Patient Stated Goal: home today, back to normal life  OT Frequency:     Barriers to D/C:            Co-evaluation              AM-PAC PT "6 Clicks" Daily Activity     Outcome Measure Help from another person eating meals?: None Help from another person taking care of personal grooming?: A Little Help from another person toileting, which includes using toliet, bedpan, or urinal?: None Help from another person bathing (including washing, rinsing, drying)?: A Little Help from another person to put on and taking off regular upper body clothing?: A Little Help from another person to put on and taking off regular lower body clothing?: None 6 Click Score: 21   End of Session    Activity Tolerance: Patient tolerated treatment well Patient left: with call bell/phone within reach  OT Visit Diagnosis: Pain Pain - Right/Left: Left Pain - part of body: Shoulder                Time: 0981-1914 OT Time Calculation (min): 33 min Charges:  OT General Charges $OT Visit: 1 Visit OT Evaluation $OT Eval Low Complexity: 1 Low OT Treatments $Self Care/Home Management : 8-22 mins  Patrick Williamson, OT Acute Rehabilitation Services Pager: [phone] Office: [phone]]   Patrick Williamson 09/19/2018, 9:33 AM

## 2018-09-21 MED FILL — Thrombin (Recombinant) For Soln 5000 Unit: CUTANEOUS | Qty: 5000 | Status: AC

## 2018-09-21 NOTE — Anesthesia Postprocedure Evaluation (Signed)
Anesthesia Post Note  Patient: Ethelene HalGary K Hestand  Procedure(s) Performed: LEFT ANATOMIC TOTAL SHOULDER ARTHROPLASTY (Left Shoulder)     Patient location during evaluation: PACU Anesthesia Type: General Level of consciousness: awake and alert Pain management: pain level controlled Vital Signs Assessment: post-procedure vital signs reviewed and stable Respiratory status: spontaneous breathing, nonlabored ventilation and respiratory function stable Cardiovascular status: blood pressure returned to baseline and stable Postop Assessment: no apparent nausea or vomiting Anesthetic complications: no    Last Vitals:  Vitals:   09/19/18 0455 09/19/18 0854  BP: 119/68 134/71  Pulse: 75 83  Resp:  16  Temp: 36.9 C 36.9 C  SpO2: 96% 97%    Last Pain:  Vitals:   09/19/18 1000  TempSrc:   PainSc: 0-No pain   Pain Goal: Patients Stated Pain Goal: 3 (09/18/18 1345)               Lucretia Kernarolyn E Rio Taber

## 2018-09-22 NOTE — Discharge Summary (Signed)
Orthopedic Discharge Summary        Physician Discharge Summary  Patient ID: Patrick Williamson MRN: [id number] DOB/AGE: Apr 08, 1956 62 y.o.  Admit date: 09/18/2018 Discharge date: 09/19/18  Procedures:  Procedure(s) (LRB): LEFT ANATOMIC TOTAL SHOULDER ARTHROPLASTY (Left)  Attending Physician:  Dr. Malon KindleSteven Norris  Admission Diagnoses:   Left shoulder end stage osteoarthritis  Discharge Diagnoses:  Left shoulder end stage osteoarthritis   Past Medical History:  Diagnosis Date  . Arthritis    everywhere  . History of kidney stones   . Kidney stone     PCP: [facility]   Discharged Condition: good  Hospital Course:  Patient underwent the above stated procedure on 09/18/2018. Patient tolerated the procedure well and brought to the recovery room in good condition and subsequently to the floor. Patient had an uncomplicated hospital course and was stable for discharge.   Disposition:  with follow up in 2 weeks   Follow-up Information    Beverely LowNorris, Steve, MD. Call in 2 weeks.   Specialty:  Orthopedic Surgery Why:  [phone] Contact information: [address] [place] [address] [phone]             Allergies as of 09/19/2018      Reactions   Penicillins Itching, Rash   Has patient had a PCN reaction causing immediate rash, facial/tongue/throat swelling, SOB or lightheadedness with hypotension: Yes Has patient had a PCN reaction causing severe rash involving mucus membranes or skin necrosis: No Has patient had a PCN reaction that required hospitalization: No Has patient had a PCN reaction occurring within the last 10 years: No If all of the above answers are "NO", then may proceed with Cephalosporin use.      Medication List    STOP taking these medications   HYDROcodone-acetaminophen 5-325 MG tablet Commonly known as:  NORCO/VICODIN   ibuprofen 600 MG tablet Commonly known as:  ADVIL,MOTRIN     TAKE these medications     aspirin EC 81 MG tablet Take 81 mg by mouth daily.   aspirin 325 MG EC tablet Take 650 mg by mouth daily as needed for pain.   EXCEDRIN PM 38-500 MG Tabs Generic drug:  diphenhydrAMINE-APAP (sleep) Take 2 tablets by mouth at bedtime.   methocarbamol 500 MG tablet Commonly known as:  ROBAXIN Take 1 tablet (500 mg total) by mouth 3 (three) times daily as needed.   oxyCODONE-acetaminophen 5-325 MG tablet Commonly known as:  PERCOCET/ROXICET Take 1-2 tablets by mouth every 4 (four) hours as needed for severe pain.   sildenafil 20 MG tablet Commonly known as:  REVATIO Take 60 mg by mouth daily as needed for erectile dysfunction.   tamsulosin 0.4 MG Caps capsule Commonly known as:  FLOMAX Take 1 capsule (0.4 mg total) by mouth daily.   VISINE OP Place 1 drop into both eyes daily as needed (redness).         Signed: Thea Gisthomas B Makaylynn Bonillas 09/22/2018, 10:11 AM  [facility]  [place] [address], [place], [place] [address] Phone: [phone] Facebook  Instagram  [employer]  Twitter

## 2019-03-30 IMAGING — CT CT RENAL STONE PROTOCOL
2 of 4 series · 16 of 46 positions shown, 18 images · non-contrast
Comparison: None.

CLINICAL DATA: Right flank pain.

EXAM:
CT ABDOMEN AND PELVIS WITHOUT CONTRAST
TECHNIQUE: Multidetector CT imaging of the abdomen and pelvis was performed
following the standard protocol without IV contrast.

[Series 2: stone full standard · axial · 0.78mm/px · z∈[-1158,-683]mm · 13 of 105 slices shown, 15 images]
[im 5/105  soft-tissue]
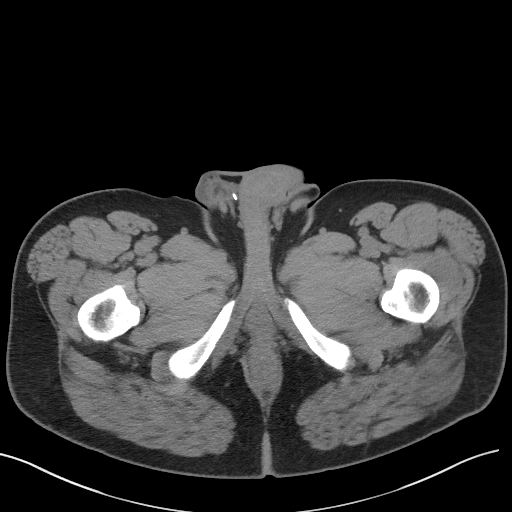
[im 5/105  bone]
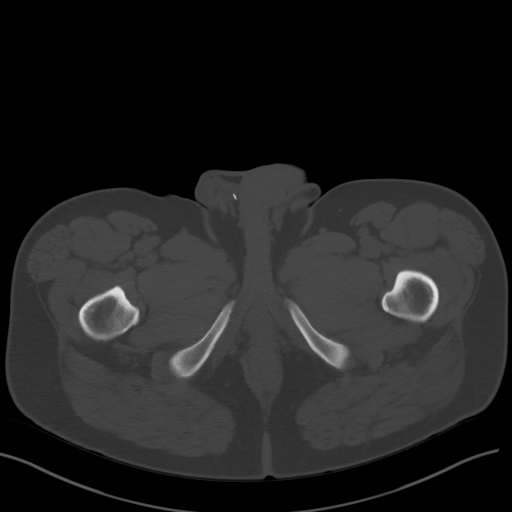
[im 14/105  soft-tissue]
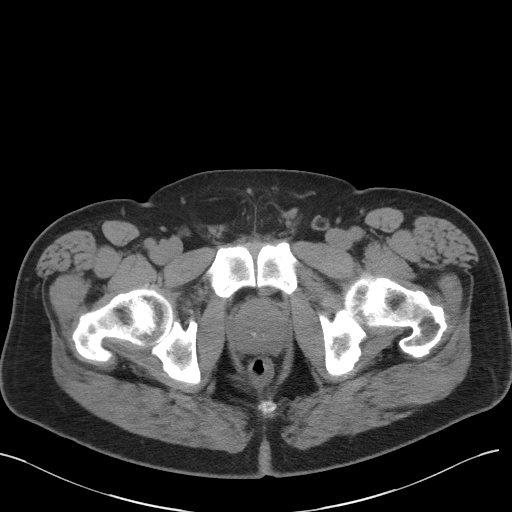
[im 22/105  soft-tissue]
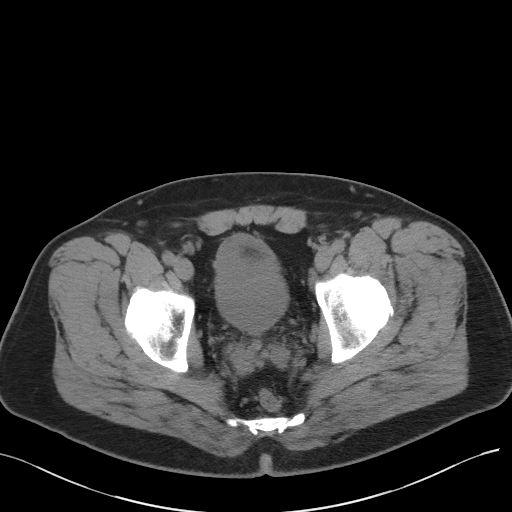
[im 31/105  soft-tissue]
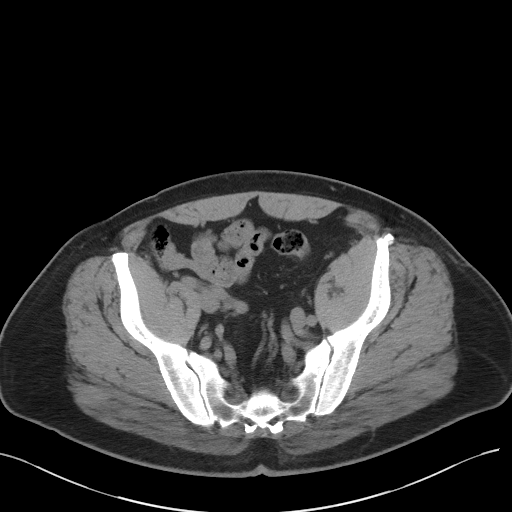
[im 35/105  soft-tissue]
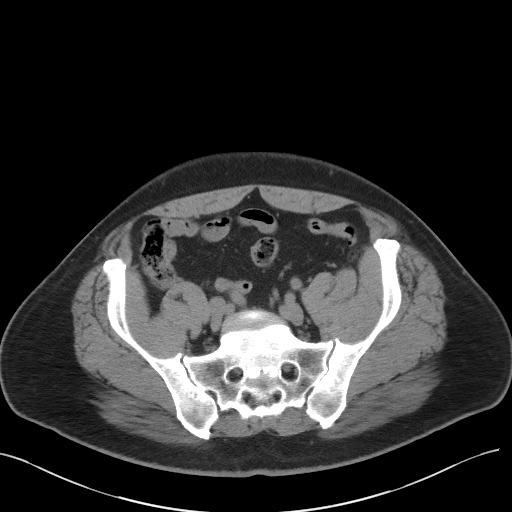
[im 44/105  soft-tissue]
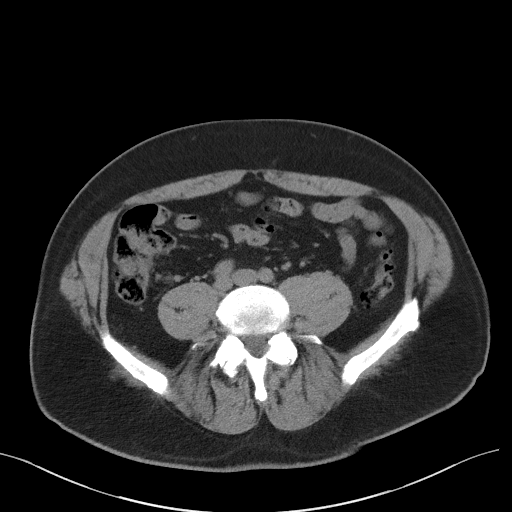
[im 53/105  soft-tissue]
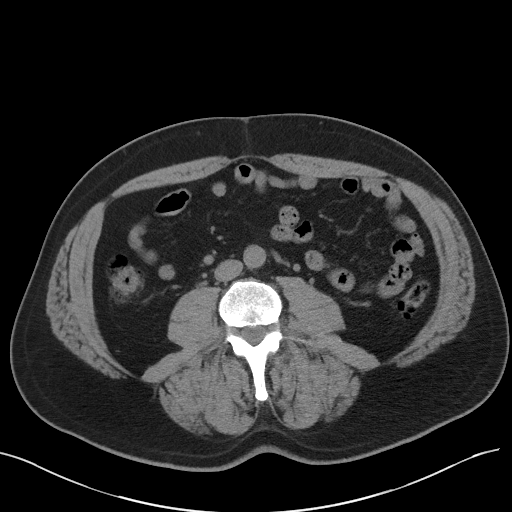
[im 61/105  soft-tissue]
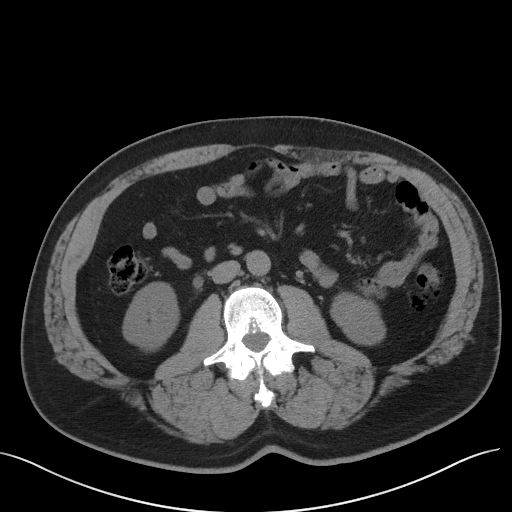
[im 70/105  soft-tissue]
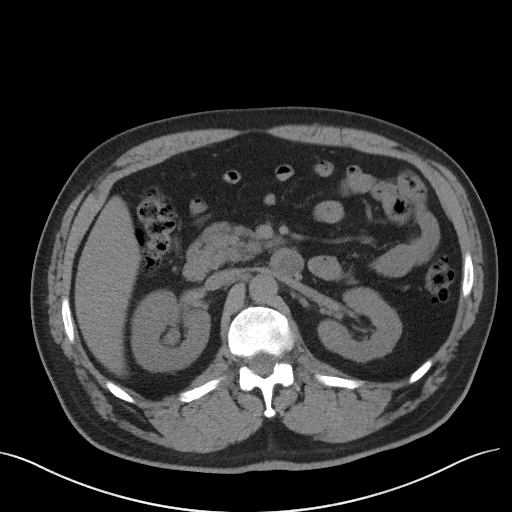
[im 70/105  bone]
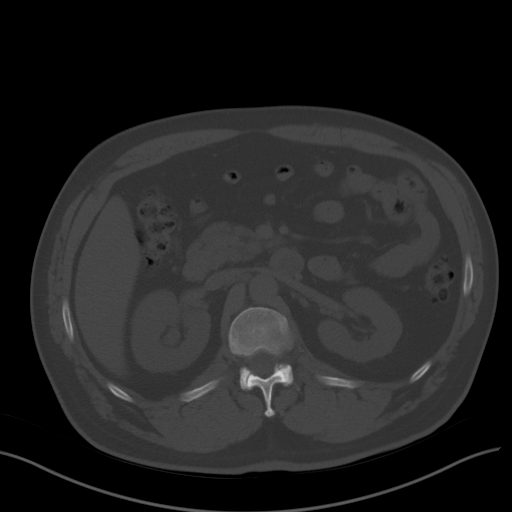
[im 74/105  soft-tissue]
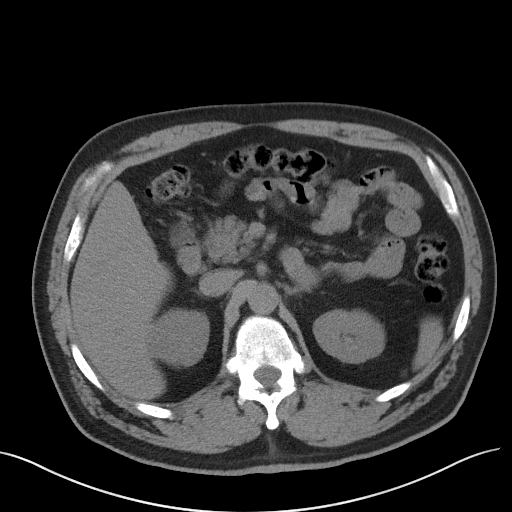
[im 83/105  soft-tissue]
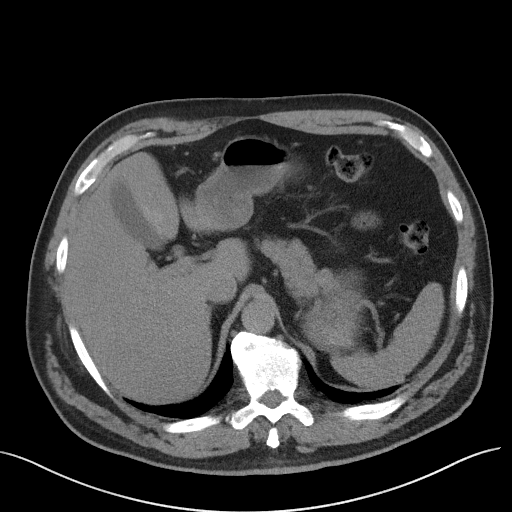
[im 92/105  soft-tissue]
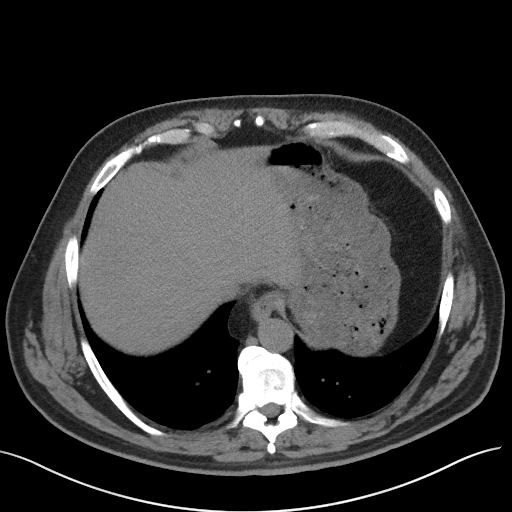
[im 100/105  soft-tissue]
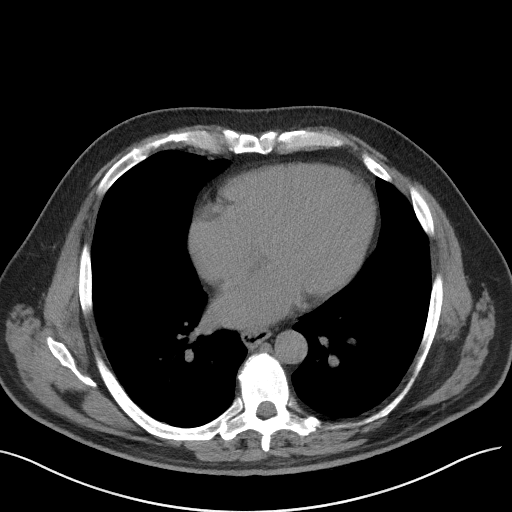

[Series 5: coronal · coronal · 0.82mm/px · 3 of 134 slices shown]
[im 45/134  soft-tissue]
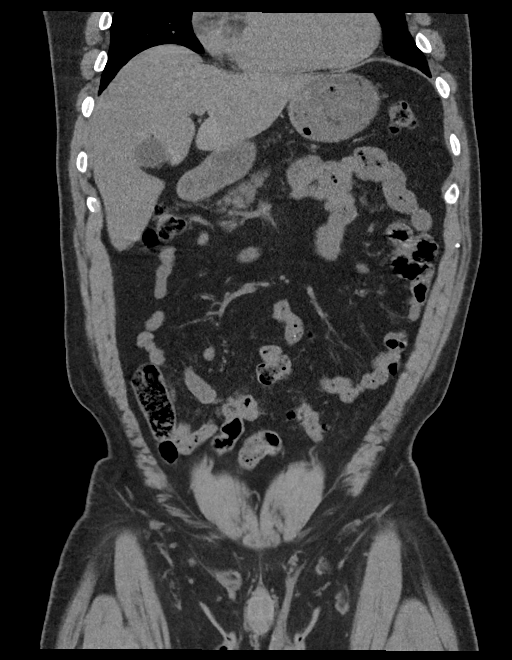
[im 60/134  soft-tissue]
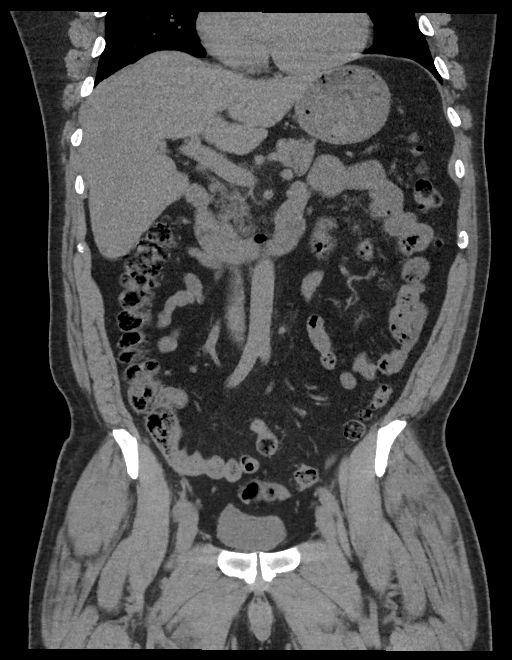
[im 74/134  soft-tissue]
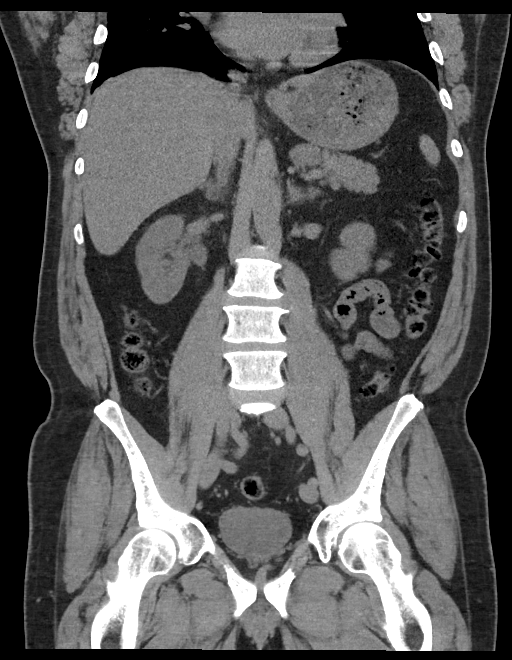

[16 of 46 positions shown; findings below may reference images not displayed]

FINDINGS: Lower chest: Lung bases are clear.

Hepatobiliary: No focal liver abnormality is seen. No gallstones,
gallbladder wall thickening, or biliary dilatation.

Pancreas: Unremarkable. No pancreatic ductal dilatation or
surrounding inflammatory changes.

Spleen: Normal in size without focal abnormality.

Adrenals/Urinary Tract: No adrenal gland nodules. Stone in the mid
right ureter at the level of L3. Stone measures 5 mm diameter.
Proximal hydronephrosis and hydroureter. Left kidney and ureter and
the bladder are unremarkable.

Stomach/Bowel: Stomach is within normal limits. Appendix is not
identified. No evidence of bowel wall thickening, distention, or
inflammatory changes.

Vascular/Lymphatic: No significant vascular findings are present. No
enlarged abdominal or pelvic lymph nodes.

Reproductive: Prostate gland is enlarged, measuring 4.5 cm diameter.

Other: No abdominal wall hernia or abnormality. No abdominopelvic
ascites.

Musculoskeletal: No acute or significant osseous findings.
IMPRESSION: 1. 5 mm stone in the mid right ureter with moderate proximal
obstruction.
2. Enlarged prostate gland.
